# Patient Record
Sex: Female | Born: 1955 | Race: Black or African American | Hispanic: No | Smoking: Never smoker
Health system: Southern US, Community
[De-identification: ages and names within clinical notes are randomized; demographics above are authoritative.]

---

## 2015-01-17 ENCOUNTER — Encounter (HOSPITAL_COMMUNITY): Payer: Self-pay | Admitting: Emergency Medicine

## 2015-01-17 ENCOUNTER — Emergency Department (HOSPITAL_COMMUNITY): Payer: No Typology Code available for payment source

## 2015-01-17 ENCOUNTER — Emergency Department (HOSPITAL_COMMUNITY)
Admission: EM | Admit: 2015-01-17 | Discharge: 2015-01-18 | Disposition: A | Discharge status: Home / Self Care | Payer: No Typology Code available for payment source | Attending: Emergency Medicine | Admitting: Emergency Medicine

## 2015-01-17 DIAGNOSIS — S199XXA Unspecified injury of neck, initial encounter: Secondary | ICD-10-CM | POA: Diagnosis not present

## 2015-01-17 DIAGNOSIS — S0081XA Abrasion of other part of head, initial encounter: Secondary | ICD-10-CM | POA: Diagnosis not present

## 2015-01-17 DIAGNOSIS — M25511 Pain in right shoulder: Secondary | ICD-10-CM

## 2015-01-17 DIAGNOSIS — S4991XA Unspecified injury of right shoulder and upper arm, initial encounter: Secondary | ICD-10-CM | POA: Insufficient documentation

## 2015-01-17 DIAGNOSIS — Y92410 Unspecified street and highway as the place of occurrence of the external cause: Secondary | ICD-10-CM | POA: Insufficient documentation

## 2015-01-17 DIAGNOSIS — S80211A Abrasion, right knee, initial encounter: Secondary | ICD-10-CM | POA: Diagnosis not present

## 2015-01-17 DIAGNOSIS — Y998 Other external cause status: Secondary | ICD-10-CM | POA: Diagnosis not present

## 2015-01-17 DIAGNOSIS — M549 Dorsalgia, unspecified: Secondary | ICD-10-CM

## 2015-01-17 DIAGNOSIS — Y9389 Activity, other specified: Secondary | ICD-10-CM | POA: Diagnosis not present

## 2015-01-17 DIAGNOSIS — M25571 Pain in right ankle and joints of right foot: Secondary | ICD-10-CM

## 2015-01-17 DIAGNOSIS — S3992XA Unspecified injury of lower back, initial encounter: Secondary | ICD-10-CM | POA: Insufficient documentation

## 2015-01-17 DIAGNOSIS — S0990XA Unspecified injury of head, initial encounter: Secondary | ICD-10-CM | POA: Diagnosis not present

## 2015-01-17 DIAGNOSIS — S99911A Unspecified injury of right ankle, initial encounter: Secondary | ICD-10-CM | POA: Diagnosis not present

## 2015-01-17 MED ORDER — OXYCODONE-ACETAMINOPHEN 5-325 MG PO TABS
2.0000 | ORAL_TABLET | Freq: Once | ORAL | Status: DC
  Filled 2015-01-17: qty 2
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
## 2015-01-18 MED ORDER — IBUPROFEN 600 MG PO TABS: 600.0000 mg | ORAL_TABLET | Freq: Four times a day (QID) | ORAL | Status: AC | PRN

## 2015-01-18 MED ORDER — OXYCODONE-ACETAMINOPHEN 5-325 MG PO TABS: 1.0000 | ORAL_TABLET | Freq: Four times a day (QID) | ORAL | Status: AC | PRN

## 2015-01-18 NOTE — ED Notes (Signed)
Ambulated patient in hallway with minimal assistance. During ambulation patient does complain of lower back pain, however denies any lightheadedness or dizziness. No unsteady gait noted.
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
